# Patient Record
Sex: Female | Born: 1970 | Race: White | Hispanic: No | Marital: Married | Smoking: Former smoker
Health system: Southern US, Community
[De-identification: ages and names within clinical notes are randomized; demographics above are authoritative.]

## PROBLEM LIST (undated history)

## (undated) DIAGNOSIS — K589 Irritable bowel syndrome without diarrhea: Secondary | ICD-10-CM

## (undated) DIAGNOSIS — T7840XA Allergy, unspecified, initial encounter: Secondary | ICD-10-CM

## (undated) DIAGNOSIS — H269 Unspecified cataract: Secondary | ICD-10-CM

## (undated) DIAGNOSIS — R760 Raised antibody titer: Secondary | ICD-10-CM

## (undated) DIAGNOSIS — J45909 Unspecified asthma, uncomplicated: Secondary | ICD-10-CM

## (undated) DIAGNOSIS — K648 Other hemorrhoids: Secondary | ICD-10-CM

## (undated) HISTORY — DX: Irritable bowel syndrome, unspecified: K58.9

## (undated) HISTORY — DX: Unspecified asthma, uncomplicated: J45.909

## (undated) HISTORY — DX: Other hemorrhoids: K64.8

## (undated) HISTORY — DX: Unspecified cataract: H26.9

## (undated) HISTORY — DX: Allergy, unspecified, initial encounter: T78.40XA

## (undated) HISTORY — PX: CATARACT EXTRACTION, BILATERAL: SHX1313

## (undated) HISTORY — PX: WISDOM TOOTH EXTRACTION: SHX21

## (undated) HISTORY — PX: DILATION AND CURETTAGE OF UTERUS: SHX78

## (undated) HISTORY — DX: Raised antibody titer: R76.0

---

## 1998-01-11 ENCOUNTER — Inpatient Hospital Stay (HOSPITAL_COMMUNITY): Admission: AD | Admit: 1998-01-11 | Discharge: 1998-01-11 | Payer: Self-pay | Admitting: Obstetrics and Gynecology

## 1998-01-11 ENCOUNTER — Other Ambulatory Visit: Admission: RE | Admit: 1998-01-11 | Discharge: 1998-01-11 | Payer: Self-pay | Admitting: *Deleted

## 1998-01-31 ENCOUNTER — Other Ambulatory Visit: Admission: RE | Admit: 1998-01-31 | Discharge: 1998-01-31 | Payer: Self-pay | Admitting: *Deleted

## 1999-03-12 ENCOUNTER — Other Ambulatory Visit: Admission: RE | Admit: 1999-03-12 | Discharge: 1999-03-12 | Payer: Self-pay | Admitting: *Deleted

## 2000-05-12 ENCOUNTER — Other Ambulatory Visit: Admission: RE | Admit: 2000-05-12 | Discharge: 2000-05-12 | Payer: Self-pay | Admitting: *Deleted

## 2001-11-16 ENCOUNTER — Other Ambulatory Visit: Admission: RE | Admit: 2001-11-16 | Discharge: 2001-11-16 | Payer: Self-pay | Admitting: *Deleted

## 2003-03-27 ENCOUNTER — Other Ambulatory Visit: Admission: RE | Admit: 2003-03-27 | Discharge: 2003-03-27 | Payer: Self-pay | Admitting: *Deleted

## 2003-08-02 ENCOUNTER — Ambulatory Visit (HOSPITAL_COMMUNITY): Admission: RE | Admit: 2003-08-02 | Discharge: 2003-08-02 | Payer: Self-pay | Admitting: Obstetrics and Gynecology

## 2003-08-15 ENCOUNTER — Encounter: Admission: RE | Admit: 2003-08-15 | Discharge: 2003-08-15 | Payer: Self-pay | Admitting: Obstetrics and Gynecology

## 2003-09-08 ENCOUNTER — Inpatient Hospital Stay (HOSPITAL_COMMUNITY): Admission: AD | Admit: 2003-09-08 | Discharge: 2003-09-14 | Payer: Self-pay | Admitting: Obstetrics and Gynecology

## 2003-10-23 ENCOUNTER — Other Ambulatory Visit: Admission: RE | Admit: 2003-10-23 | Discharge: 2003-10-23 | Payer: Self-pay | Admitting: Obstetrics and Gynecology

## 2004-05-28 ENCOUNTER — Ambulatory Visit: Payer: Self-pay | Admitting: Internal Medicine

## 2004-05-29 ENCOUNTER — Ambulatory Visit: Payer: Self-pay | Admitting: Internal Medicine

## 2004-12-06 ENCOUNTER — Other Ambulatory Visit: Admission: RE | Admit: 2004-12-06 | Discharge: 2004-12-06 | Payer: Self-pay | Admitting: Obstetrics and Gynecology

## 2008-01-14 DIAGNOSIS — R109 Unspecified abdominal pain: Secondary | ICD-10-CM | POA: Insufficient documentation

## 2008-01-14 DIAGNOSIS — K589 Irritable bowel syndrome without diarrhea: Secondary | ICD-10-CM | POA: Insufficient documentation

## 2008-01-17 ENCOUNTER — Ambulatory Visit: Payer: Self-pay | Admitting: Internal Medicine

## 2008-02-08 ENCOUNTER — Ambulatory Visit (HOSPITAL_COMMUNITY): Admission: RE | Admit: 2008-02-08 | Discharge: 2008-02-08 | Payer: Self-pay | Admitting: Internal Medicine

## 2009-05-08 ENCOUNTER — Encounter (INDEPENDENT_AMBULATORY_CARE_PROVIDER_SITE_OTHER): Payer: Self-pay | Admitting: *Deleted

## 2009-06-12 ENCOUNTER — Encounter (INDEPENDENT_AMBULATORY_CARE_PROVIDER_SITE_OTHER): Payer: Self-pay | Admitting: *Deleted

## 2009-06-14 ENCOUNTER — Ambulatory Visit: Payer: Self-pay | Admitting: Internal Medicine

## 2009-07-03 ENCOUNTER — Ambulatory Visit: Payer: Self-pay | Admitting: Internal Medicine

## 2010-02-03 IMAGING — US US ABDOMEN COMPLETE
1 series · 14 of 25 positions shown · non-contrast
Comparison: None relevant.

CLINICAL DATA: Abdominal pain.

ABDOMEN ULTRASOUND
TECHNIQUE: Complete abdominal ultrasound examination was performed
including evaluation of the liver, gallbladder, bile ducts,
pancreas, kidneys, spleen, IVC, and abdominal aorta.

[Series 1: us abdomen complete · 0.24mm/px · 14 of 55 slices shown]
[im 1/55]
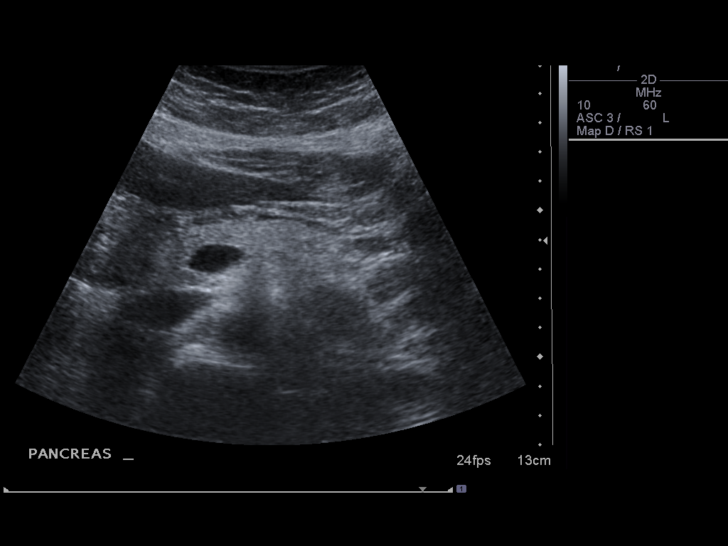
[im 5/55]
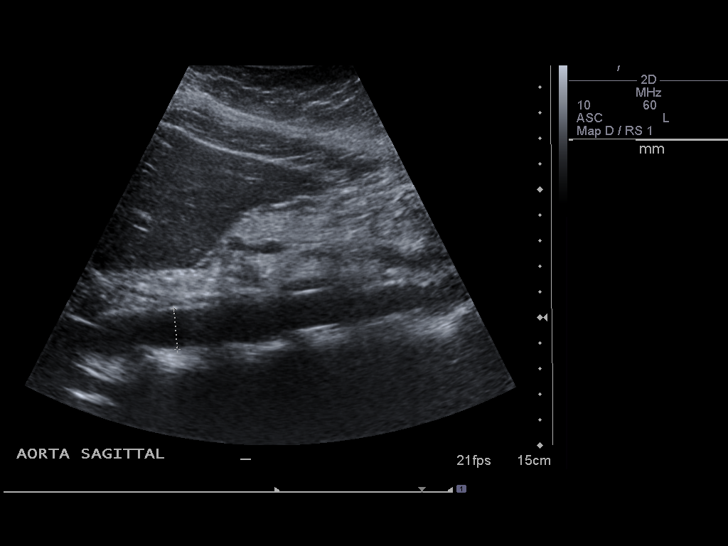
[im 10/55]
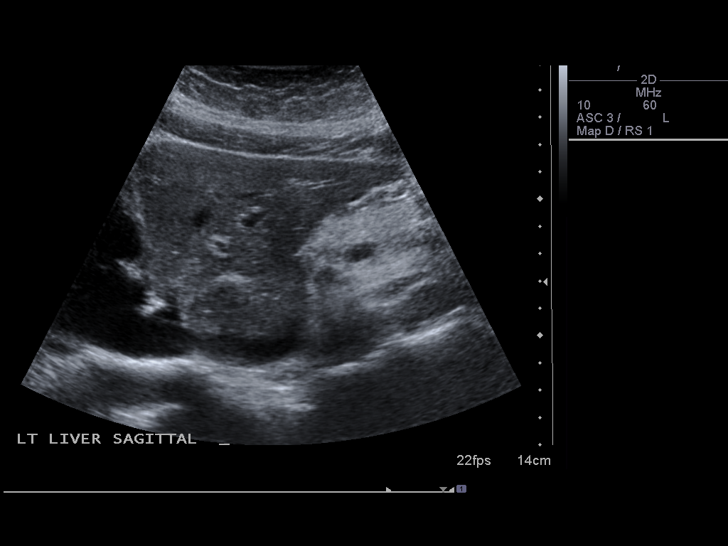
[im 14/55]
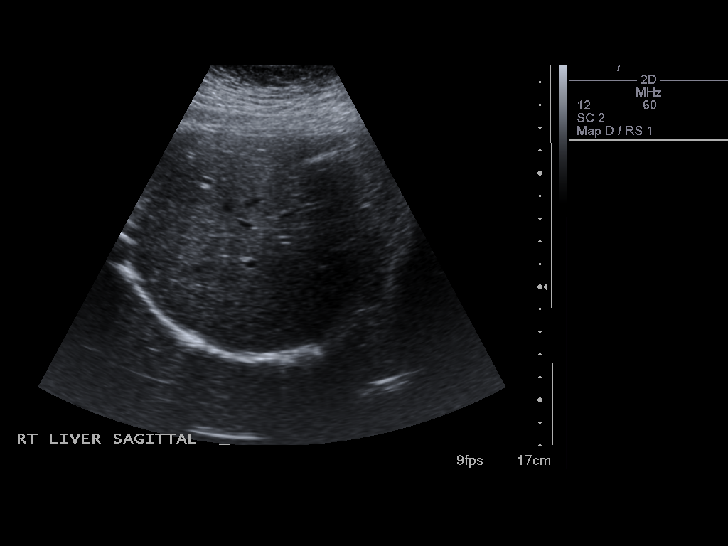
[im 19/55]
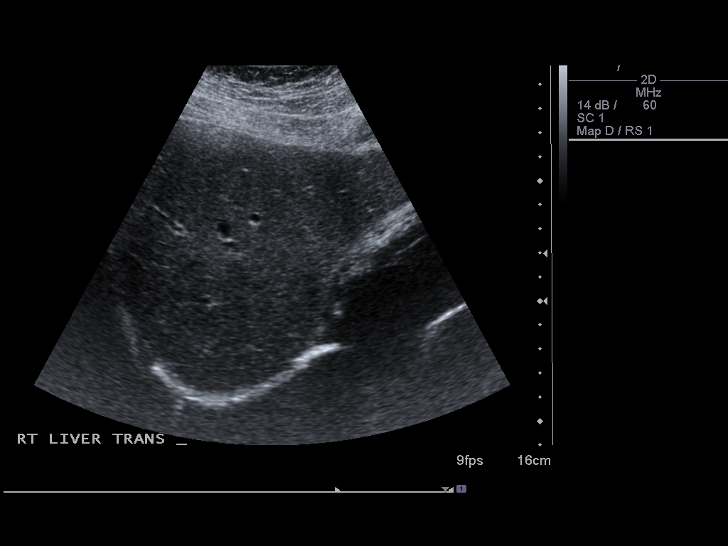
[im 21/55]
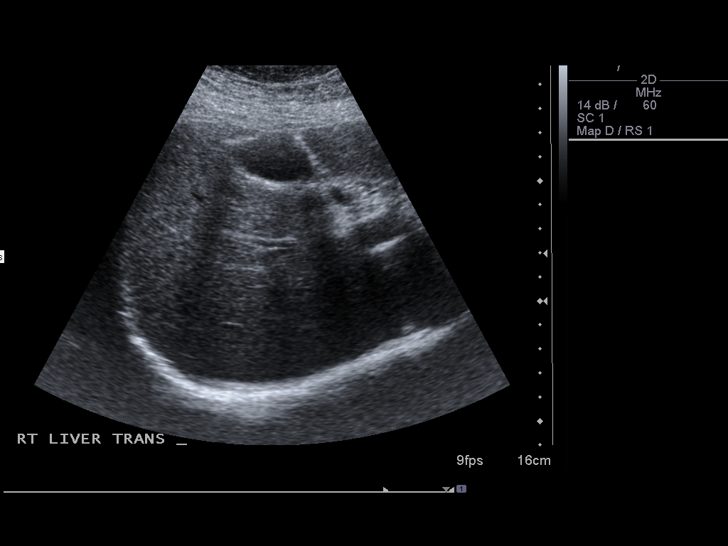
[im 25/55]
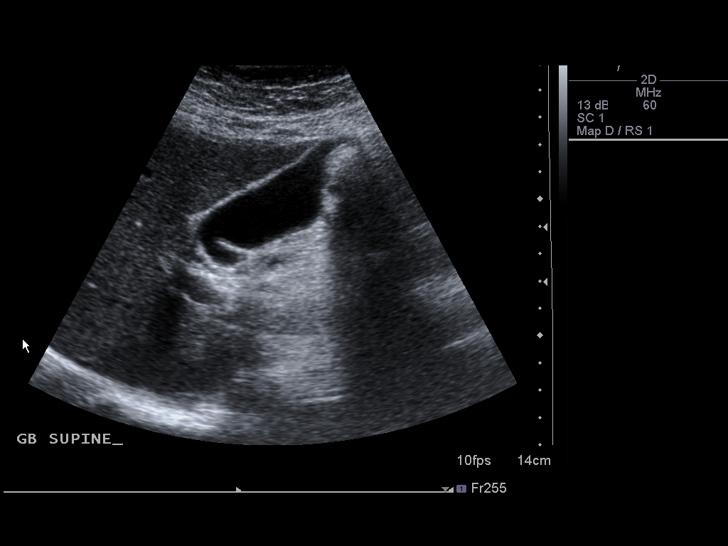
[im 30/55]
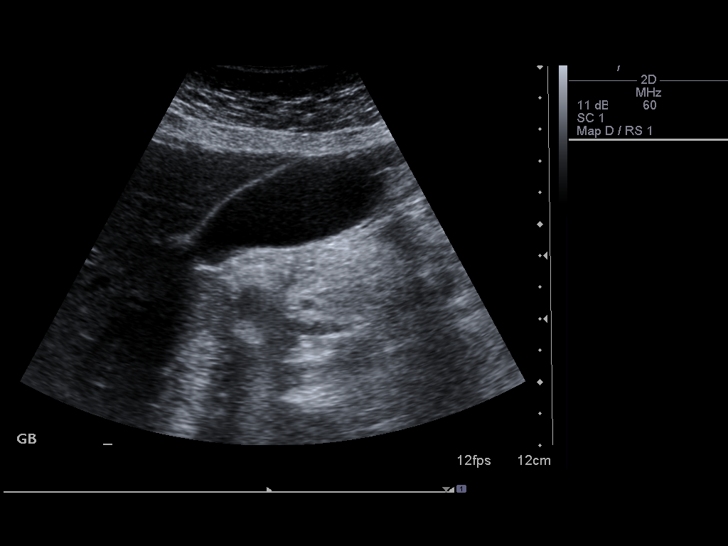
[im 34/55]
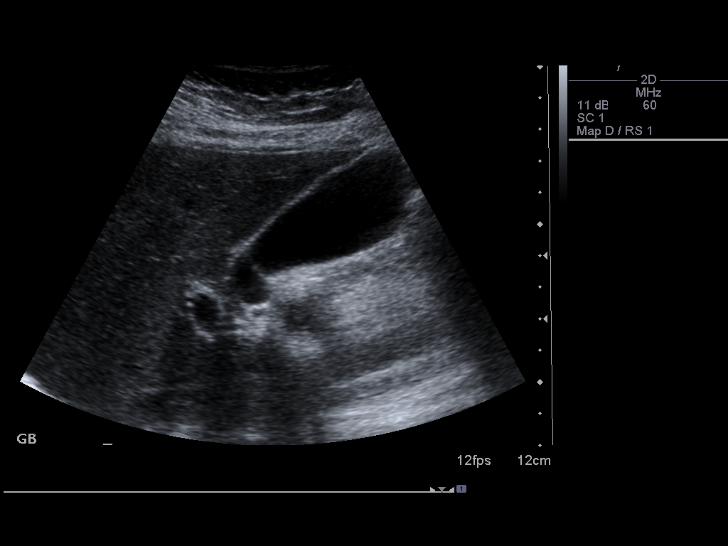
[im 37/55]
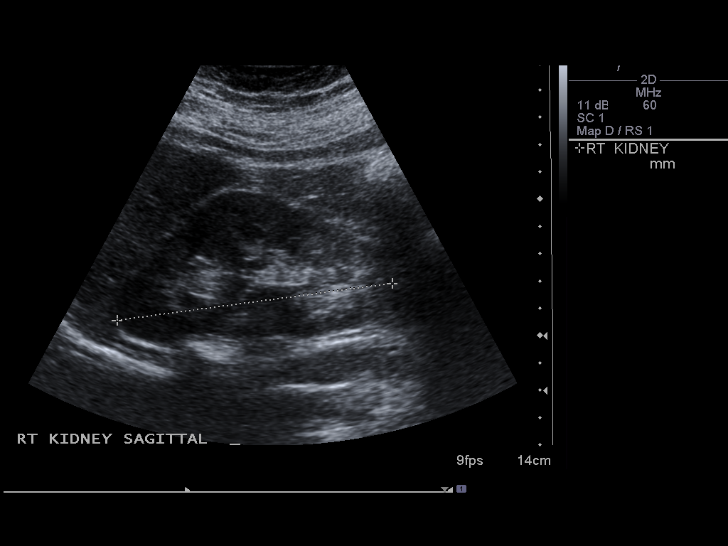
[im 41/55]
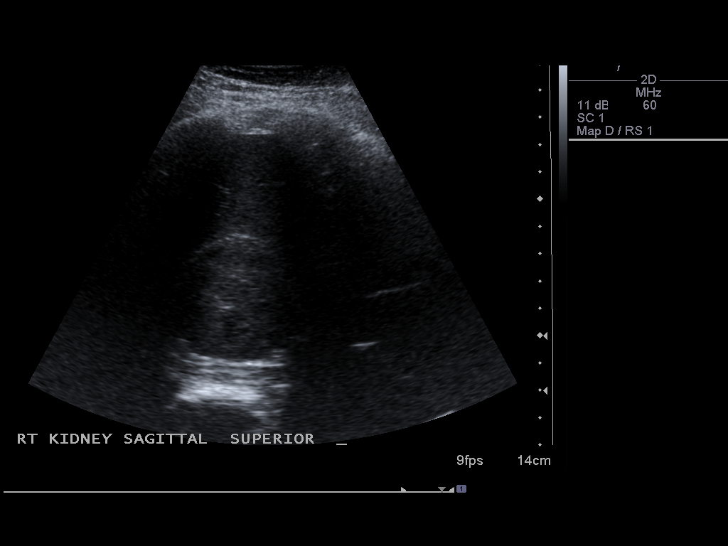
[im 46/55]
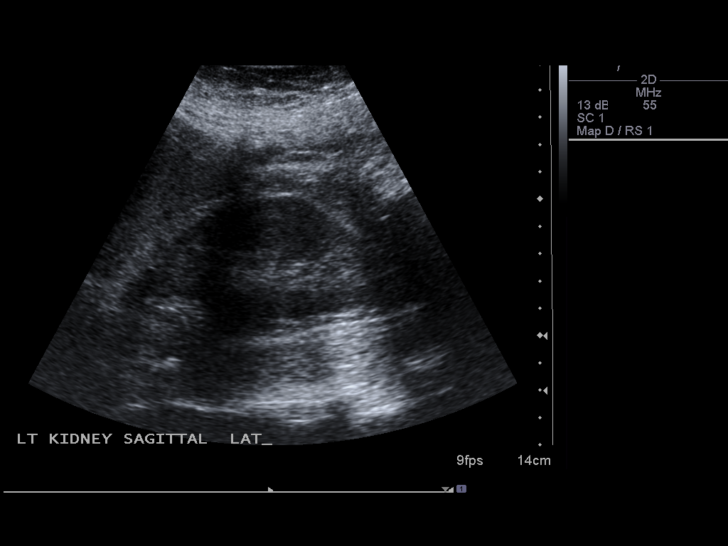
[im 50/55]
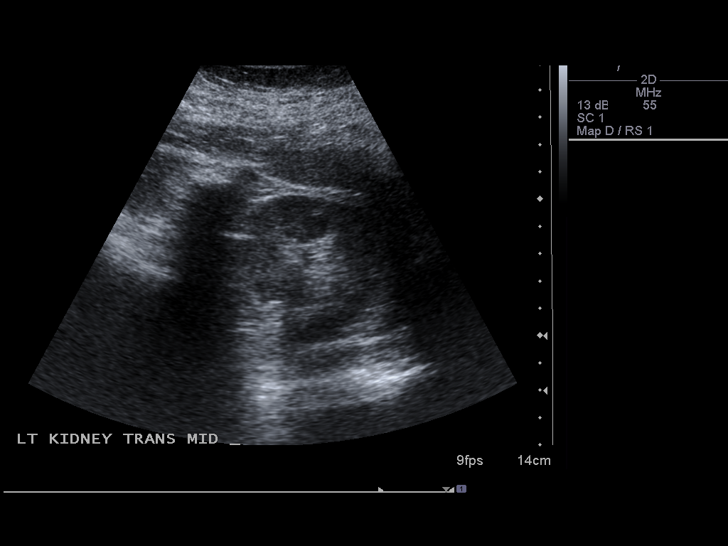
[im 55/55]
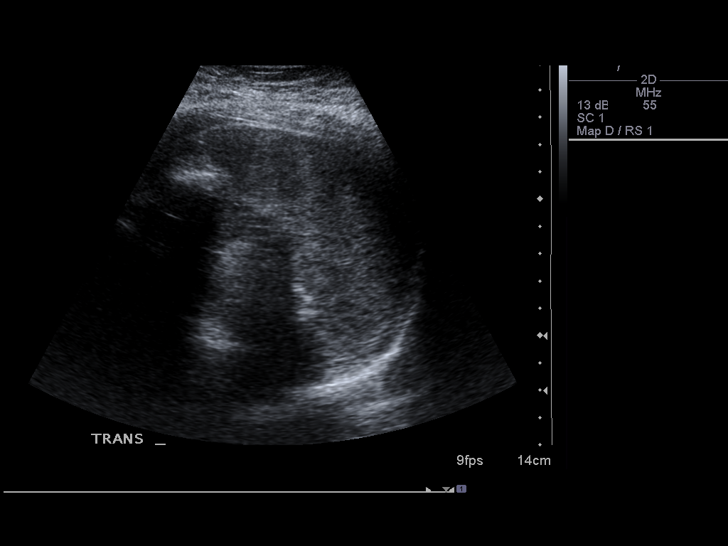

[14 of 25 positions shown; findings below may reference images not displayed]

FINDINGS: The gallbladder appears normal without gallstones or wall
thickening.  There is no biliary dilatation.  The visualized
portions of the liver, spleen, pancreas, IVC and abdominal aorta
appear normal.

Both kidneys appear normal.  The right kidney measures 10.2 cm in
length and the left kidney measures 10.8 cm in length.  There is no
hydronephrosis.
IMPRESSION: Normal abdominal ultrasound.  No evidence of biliary disease.

## 2010-02-28 NOTE — Miscellaneous (Signed)
Summary: anusol [facility] RX  Clinical Lists Changes  Medications: Added new medication of ANUSOL-HC 25 MG  SUPP (HYDROCORTISONE ACETATE) per rectum at bedtime as prn - Signed Rx of ANUSOL-HC 25 MG  SUPP (HYDROCORTISONE ACETATE) per rectum at bedtime as prn;  #12 x 1;  Signed;  Entered by: Greer Ee RN;  Authorized by: Hart Carwin MD;  Method used: Electronically to [facility]*, [address] [place], [place], [place]  [address], Ph: [phone], Fax: [phone]    Prescriptions: ANUSOL-HC 25 MG  SUPP (HYDROCORTISONE ACETATE) per rectum at bedtime as prn  #12 x 1   Entered by:   Greer Ee RN   Authorized by:   Hart Carwin MD   Signed by:   Greer Ee RN on 07/03/2009   Method used:   Electronically to        [facility]* (retail)       [address]       [place], [place]  [address]       Ph: [phone]       Fax: [phone]   RxID:   [id number]

## 2010-02-28 NOTE — Letter (Signed)
Summary: [facility] Instructions  [name]  [address] [place], [place] [address]   Phone: [phone]  Fax: [phone]       Joy Foster    06/17/1970    MRN: [id number]       Procedure Day Dorna Bloom:  [name] 07/03/09     Arrival Time:  8:00am     Procedure Time:  9:00am    Location of Procedure:                    [name]  [facility] (4th Floor) _   PREPARATION FOR COLONOSCOPY WITH MIRALAX  Starting 5 days prior to your procedure  THURSDAY 06/02  do not eat nuts, seeds, popcorn, corn, beans, peas,  salads, or any raw vegetables.  Do not take any fiber supplements (e.g. Metamucil, Citrucel, and Benefiber). ____________________________________________________________________________________________________   THE DAY BEFORE YOUR PROCEDURE         DATE: 07-02-09, Monday  1   Drink clear liquids the entire day-NO SOLID FOOD  2   Do not drink anything colored red or purple.  Avoid juices with pulp.  No orange juice.  3   Drink at least 64 oz. (8 glasses) of fluid/clear liquids during the day to prevent dehydration and help the prep work efficiently.  CLEAR LIQUIDS INCLUDE: Water Jello Ice Popsicles Tea (sugar ok, no milk/cream) Powdered fruit flavored drinks Coffee (sugar ok, no milk/cream) Gatorade Juice: apple, white grape, white cranberry  Lemonade Clear bullion, consomm, broth Carbonated beverages (any kind) Strained chicken noodle soup Hard Candy  4   Mix the entire bottle of Miralax with 64 oz. of Gatorade/Powerade in the morning and put in the refrigerator to chill.  5   At 3:00 pm take 2 Dulcolax/Bisacodyl tablets.  6   At 4:30 pm take one Reglan/Metoclopramide tablet.  7  Starting at 5:00 pm drink one 8 oz glass of the Miralax mixture every 15-20 minutes until you have finished drinking the entire 64 oz.  You should finish drinking prep around 7:30 or 8:00 pm.  8   If you are nauseated, you may take the 2nd  Reglan/Metoclopramide tablet at 6:30 pm.        9    At 8:00 pm take 2 more DULCOLAX/Bisacodyl tablets.     THE DAY OF YOUR PROCEDURE      DATE:  TUESDAY 06/07  You may drink clear liquids until  7:00am   (2 HOURS BEFORE PROCEDURE).   MEDICATION INSTRUCTIONS  Unless otherwise instructed, you should take regular prescription medications with a small sip of water as early as possible the morning of your procedure.  Diabetic patients - see separate instructions.  Stop taking Plavix or Aggrenox on  _  _  (7 days before procedure).           Stop taking Coumadin on  _  _  (5 days before procedure).  Additional medication instructions: _  _         OTHER INSTRUCTIONS  You will need a responsible adult at least 38 years of age to accompany you and drive you home.   This person must remain in the waiting room during your procedure.  Wear loose fitting clothing that is easily removed.  Leave jewelry and other valuables at home.  However, you may wish to bring a book to read or an iPod/MP3 player to listen to music as you wait for your procedure to start.  Remove all  body piercing jewelry and leave at home.  Total time from sign-in until discharge is approximately 2-3 hours.  You should go home directly after your procedure and rest.  You can resume normal activities the day after your procedure.  The day of your procedure you should not:   Drive   Make legal decisions   Operate machinery   Drink alcohol   Return to work  You will receive specific instructions about eating, activities and medications before you leave.   The above instructions have been reviewed and explained to me by   _______________________    I fully understand and can verbalize these instructions _____________________________ Date _______

## 2010-02-28 NOTE — Letter (Signed)
Summary: Colonoscopy Letter  [facility]  [address] [place], [place] [address]   Phone: [phone]  Fax: [phone]      May 08, 2009 MRN: [id number]   Joy Foster [address] [place], [place]  [address]   Dear [name],   According to your medical record, it is time for you to schedule a Colonoscopy. The American Cancer Society recommends this procedure as a method to detect early colon cancer. Patients with a family history of colon cancer, or a personal history of colon polyps or inflammatory bowel disease are at increased risk.  This letter has beeen generated based on the recommendations made at the time of your procedure. If you feel that in your particular situation this may no longer apply, please contact our office.  Please call our office at [phone] to schedule this appointment or to update your records at your earliest convenience.  Thank you for cooperating with [place] to provide you with the very best care possible.   Sincerely,  Hedwig Morton. Juanda Chance, M.D  [facility] [phone]

## 2010-02-28 NOTE — Miscellaneous (Signed)
Summary: [facility] previsit  Clinical Lists Changes  Medications: Added new medication of MIRALAX   POWD (POLYETHYLENE GLYCOL 3350) As per prep  instructions. - Signed Added new medication of METOCLOPRAMIDE HCL 10 MG  TABS (METOCLOPRAMIDE HCL) As per prep instructions. - Signed Added new medication of DULCOLAX 5 MG  TBEC (BISACODYL) Day before procedure take 2 at 3pm and 2 at 8pm. - Signed Rx of MIRALAX   POWD (POLYETHYLENE GLYCOL 3350) As per prep  instructions.;  #255gm x 0;  Signed;  Entered by: Karl Bales RN;  Authorized by: Hart Carwin MD;  Method used: Electronically to [facility]*, [address], [place], [place], [place]  [address], Ph: [phone], Fax: [phone] Rx of METOCLOPRAMIDE HCL 10 MG  TABS (METOCLOPRAMIDE HCL) As per prep instructions.;  #2 x 0;  Signed;  Entered by: Karl Bales RN;  Authorized by: Hart Carwin MD;  Method used: Electronically to [facility]*, [address], [place], [place], [place]  [address], Ph: [phone], Fax: [phone] Rx of DULCOLAX 5 MG  TBEC (BISACODYL) Day before procedure take 2 at 3pm and 2 at 8pm.;  #4 x 0;  Signed;  Entered by: Karl Bales RN;  Authorized by: Hart Carwin MD;  Method used: Electronically to [facility]*, [address], [place], [place], [place]  [address], Ph: [phone], Fax: [phone]    Prescriptions: DULCOLAX 5 MG  TBEC (BISACODYL) Day before procedure take 2 at 3pm and 2 at 8pm.  #4 x 0   Entered by:   Karl Bales RN   Authorized by:   Hart Carwin MD   Signed by:   Karl Bales RN on 06/14/2009   Method used:   Electronically to        [facility]* (retail)       [address]       [place], [place]  [address]       Ph: [phone]       Fax: [phone]   RxID:   [id number] METOCLOPRAMIDE HCL 10 MG  TABS (METOCLOPRAMIDE HCL) As per prep instructions.  #2 x 0   Entered by:   Karl Bales RN   Authorized by:   Hart Carwin MD   Signed by:   Karl Bales RN on 06/14/2009   Method used:   Electronically to        [facility]* (retail)       [address]       [place], [place]  [address]       Ph: [phone]       Fax: [phone]   RxID:   [id number] MIRALAX   POWD (POLYETHYLENE GLYCOL 3350) As per prep  instructions.  #255gm x 0   Entered by:   Karl Bales RN   Authorized by:   Hart Carwin MD   Signed by:   Karl Bales RN on 06/14/2009   Method used:   Electronically to        [facility]* (retail)       [address]       [place], [place]  [address]       Ph: [phone]       Fax: [phone]   RxID:   [id number]

## 2010-02-28 NOTE — Procedures (Signed)
Summary: Colonoscopy  Patient: Toniqua Melamed Note: All result statuses are Final unless otherwise noted.  Tests: (1) Colonoscopy (COL)   COL Colonoscopy           DONE (C)     [facility]     [address]     [place], [place]  [address]           COLONOSCOPY PROCEDURE REPORT           PATIENT:  Genavie, Boettger  MR#:  [id number]     BIRTHDATE:  February 14, 1970, 39 yrs. old  GENDER:  female     ENDOSCOPIST:  Hedwig Morton. Juanda Chance, MD     REF. BY:  Morton Amy, M.D.     PROCEDURE DATE:  07/03/2009     PROCEDURE:  Colonoscopy [address]     ASA CLASS:  Class I     INDICATIONS:  father with polyposis and colon cancer age 78     last colon 2006     MEDICATIONS:   Versed 10 mg, Fentanyl 100 mcg           DESCRIPTION OF PROCEDURE:   After the risks benefits and     alternatives of the procedure were thoroughly explained, informed     consent was obtained.  Digital rectal exam was performed and     revealed no rectal masses.   The LB CF-[id number] endoscope     was introduced through the anus and advanced to the cecum, which     was identified by both the appendix and ileocecal valve, without     limitations.  The quality of the prep was good, using MiraLax.     The instrument was then slowly withdrawn as the colon was fully     examined.     <<PROCEDUREIMAGES>>           FINDINGS:  Internal hemorrhoids were found (see image5).  This was     otherwise a normal examination of the colon (see image4, image3,     image2, and image1).   Retroflexed views in the rectum revealed no     abnormalities.    The scope was then withdrawn from the patient     and the procedure completed.           COMPLICATIONS:  None     ENDOSCOPIC IMPRESSION:     1) Internal hemorrhoids     2) Otherwise normal examination     RECOMMENDATIONS:     1) high fiber diet     AnusolHC supp, #12, 1 qhs prn hemorrhoids     HIDA scan with CCK for eval. of RUQ abd. pain     REPEAT EXAM:  In 5 year(s) for.           ______________________________     Hedwig Morton. Juanda Chance, MD           CC:           n.     REVISED:  07/03/2009 09:46 AM     eSIGNED:   Hedwig Morton. Canaan Holzer at 07/03/2009 09:46 AM           Park Meo, [phone]  Note: An exclamation mark (!) indicates a result that was not dispersed into the flowsheet. Document Creation Date: 07/03/2009 9:47 AM _______________________________________________________________________  (1) Order result status: Final Collection or observation date-time: 07/03/2009 09:37 Requested date-time:  Receipt date-time:  Reported date-time:  Referring Physician:   Ordering Physician: Lina Sar (  [id number]) Specimen Source:  Source: Launa Grill Order Number: [id number] Lab site:   Appended Document: Colonoscopy    Clinical Lists Changes  Observations: Added new observation of COLONNXTDUE: 06/2014 (07/03/2009 12:56)

## 2010-06-14 NOTE — Op Note (Signed)
Joy Foster, ZHAO                         ACCOUNT NO.:  [id number]   MEDICAL RECORD NO.:  [id number]                   PATIENT TYPE:  INP   LOCATION:  9139                                 FACILITY:  [facility]   PHYSICIAN:  Guy Sandifer. Arleta Creek, M.D.           DATE OF BIRTH:  [birth year]   DATE OF PROCEDURE:  09/12/2003  DATE OF DISCHARGE:                                 OPERATIVE REPORT   PREOPERATIVE DIAGNOSIS:  Postpartum vaginal vulvar hematoma.   POSTOPERATIVE DIAGNOSIS:  Postpartum vaginal vulvar hematoma.   PROCEDURES:  Evacuation of vaginal vulvar hematoma.   SURGEON:  Harold Hedge, M.D.   ANESTHESIA:  Spinal, Cline Crock, M.D.   ESTIMATED BLOOD LOSS:  250 mL.   INDICATIONS AND CONSENT:  This patient is a 40 year old, white female, G6,  P3, status post vaginal delivery of a viable female infant at 33-1/2 weeks  estimated gestational age, approximately 2-3 hours prior to surgery.  About  1-1/2 hours postpartum, she began to complain of rectal and vaginal pain.  I  was called to see the patient and noted a 6-8 cm hematoma, extending from  the left vaginal sidewall onto the perineum.  Ice was applied.  After  observation of approximately 30-60 minutes, it was obviously growing to  approximately 8-10 cm, and the patient was complaining of pain.  The patient  was medicated.  Options were discussed with the patient and her husband.  Evacuation was discussed.  Potential complications were discussed including  but not limited to infection, pain, and dyspareunia.  Possible placement of  drain was also discussed.  All questions were answered.   DESCRIPTION OF PROCEDURE:  The patient is given Unasyn IV 3 g  preoperatively.  She is taken to the operating room where she is identified,  and a spinal anesthetic is placed.  She is then placed in the dorsal  lithotomy position where she is prepped vaginally and vulva and the bladder  straight-catheterized.  She is draped in a sterile  fashion.  An incision is  made along the exterior margin of the hymenal caruncles down to the 5  o'clock position in the vaginal introitus.  This is then carried deeper  until the hematoma is entered, upon which time blood squirts out rather  forcefully.  Approximately 250 mL of blood and clot are obtained from this.  The space is irrigated.  A single area of bleeding on the vaginal submucosa  is identified.  Three 2-0 chromic ligatures are placed which slows the  bleeding down substantially.  Palpation reveals this to be well clear of the  course of the urethra.  A small round J-P drain is then exited through the  left perineum and placed in the incision.  The incision is closed in a  running locking fashion with 2-0 chromic suture, leaving a 0.5 cm open space  at the inferior margin.  The vagina is then packed with 0.5  inch plain  gauze.  The patient tolerated the procedure well, and all counts are  correct.  The patient is taken to the recovery room in stable condition.                                               Guy Sandifer Arleta Creek, M.D.    JET/MEDQ  D:  09/12/2003  T:  09/12/2003  Job:  [id number]

## 2010-06-14 NOTE — Discharge Summary (Signed)
Joy Foster, Joy Foster             ACCOUNT NO.:  [id number]   MEDICAL RECORD NO.:  [id number]          PATIENT TYPE:  INP   LOCATION:  9139                          [facility]ACILITY[facility]   PHYSICIAN:  Guy Sandifer. Henderson Cloud, M.D. DATE OF BIRTH:  1970/06/30   DATE OF ADMISSION:  09/08/2003  DATE OF DISCHARGE:  09/14/2003                                 DISCHARGE SUMMARY   ADMITTING DIAGNOSES:  1.  Intrauterine pregnancy at 33-and-a-half weeks estimated gestational age.  2.  Premature rupture of membranes.  3.  Positive group B beta streptococcus.   DISCHARGE DIAGNOSES:  1.  Status post spontaneous vaginal delivery.  2.  Viable female infant.  3.  Status post evacuation of a vulvovaginal hematoma.   PROCEDURE:  1.  Spontaneous vaginal delivery.  2.  Evacuation of a vulvovaginal hematoma.   REASON FOR ADMISSION:  Please see written H&P.   HOSPITAL COURSE:  The patient was a 40 year old white married female gravida  6 para 2 that was admitted to [facility] at 33-and-a-half  weeks estimated gestational age with spontaneous rupture of membranes.  The  patient was noted to have mild irregular contractions.  She was also known  to have positive group B beta strep.  On admission, fetal heart tones were  reactive.  Cervix was dilated 2 cm, 75% effaced, vertex at a -1 station.  Ultrasound was performed which revealed an AFI of 14.84, estimated fetal  weight of 2147 g, a grade 3 placenta.  Biophysical profile was performed  which revealed a score of 8/8.  The initial plan was to allow the patient to  labor.  However, without change in cervix and irregular contractions the  decision was made to administer p.o. tocolytics to allow time for  betamethasone to enhance fetal lung maturity.  Decision was not to be more  aggressive with other tocolytics in view of positive group B beta strep with  ruptured membranes.  On the following morning, the patient was afebrile.  Abdomen was soft and  nontender.  Fetal heart tones were reactive.  On the  following day, vital signs continued to be stable and the patient was  afebrile.  Minimal contractions were noted at the present.  The patient was  now 42 hours status post betamethasone administration.  There was some  consideration in stopping the Procardia after 72 hours.  On the following  morning, the patient was without complaint.  She continued to be leaking  amniotic fluid with movement.  Vital signs were stable; she was afebrile.  CBC revealed hemoglobin of 9.7; wbc count of 9.0; and platelet count of  249,000.  Uterus was nontender.  Decision was made to discontinue the  Procardia and check a biophysical profile.  On the following morning, the  patient did progress in labor.  She subsequently delivered a spontaneous  vaginal viable female with Apgars of 8 at one minute and 9 at five minutes.  NICU was in attendance and the baby was transferred to the NICU.  The  patient did sustain a right periurethral laceration which was repaired.  The  patient  was doing well and was subsequently transferred to the mother-baby  unit.  Later that evening, the patient did complain of some rectovulvar pain  and was noted to have an 8-10 cm hematoma on the left vaginal sidewall.  Decision was made to try to evacuate the hematoma.  The patient was then  subsequently transferred to the operating room where spinal anesthesia was  administered without difficulty.  Hematoma was evacuated with an estimated  blood loss of 250 mL.  A Jackson-Pratt drain was placed.  On the following  morning, the patient was feeling better.  Vital signs were stable, blood  pressure 142/88.  The patient was ambulating well with good pain relief.  The Jackson-Pratt was noted to have minimal output.  Hemoglobin was 10.2.  The patient was placed on oral antibiotics.  On the following morning, the  Jackson-Pratt was removed, discharge instructions were reviewed, and the   patient was discharged home.   CONDITION ON DISCHARGE:  Good.   DIET:  Regular as tolerated.   ACTIVITY:  Up as tolerated.   FOLLOW-UP:  The patient is to follow up in the office in 1 week.  She is to  call for temperature greater than 100 degrees, increase in vulvar edema, or  heavy vaginal bleeding.   DISCHARGE MEDICATIONS:  1.  Percocet 5/325 #30 one p.o. q.4-6h. p.r.n.  2.  Augmentin 875 mg one p.o. b.i.d. x5 days.  3.  Prenatal vitamins one p.o. daily.  4.  Colace one p.o. daily p.r.n.      CC/MEDQ  D:  10/18/2003  T:  10/18/2003  Job:  [id number]

## 2011-04-22 ENCOUNTER — Telehealth: Payer: Self-pay | Admitting: Internal Medicine

## 2011-04-22 NOTE — Telephone Encounter (Signed)
Patient is having nausea and stomach pain that seems to be getting worse. She is willing to see an extender but is not able to come this week. She will call back on Thursday to try to schedule with an extender.

## 2012-02-17 ENCOUNTER — Encounter: Payer: Self-pay | Admitting: *Deleted

## 2012-02-17 ENCOUNTER — Telehealth: Payer: Self-pay | Admitting: Internal Medicine

## 2012-02-17 NOTE — Telephone Encounter (Signed)
Patient calling to report nausea x 2 days. She took Zofran but continues to have nausea. Denies vomiting. C/O RUQ pain x 2 weeks. Pain is right under rib cage. Increase in belching. Denies fever and vomiting. Scheduled patient with Dr. Juanda Chance on 02/18/12 at 2:45 PM.

## 2012-02-18 ENCOUNTER — Other Ambulatory Visit (INDEPENDENT_AMBULATORY_CARE_PROVIDER_SITE_OTHER): Payer: BC Managed Care – PPO

## 2012-02-18 ENCOUNTER — Encounter: Payer: Self-pay | Admitting: Internal Medicine

## 2012-02-18 ENCOUNTER — Ambulatory Visit (INDEPENDENT_AMBULATORY_CARE_PROVIDER_SITE_OTHER): Payer: BC Managed Care – PPO | Admitting: Internal Medicine

## 2012-02-18 VITALS — BP 110/74 | HR 60 | Ht 62.0 in | Wt 152.0 lb

## 2012-02-18 DIAGNOSIS — R1011 Right upper quadrant pain: Secondary | ICD-10-CM

## 2012-02-18 DIAGNOSIS — R110 Nausea: Secondary | ICD-10-CM

## 2012-02-18 LAB — CBC WITH DIFFERENTIAL/PLATELET
Basophils Absolute: 0 10*3/uL (ref 0.0–0.1)
Eosinophils Relative: 2.9 % (ref 0.0–5.0)
Lymphs Abs: 1.2 10*3/uL (ref 0.7–4.0)
MCHC: 34.3 g/dL (ref 30.0–36.0)
MCV: 84.4 fl (ref 78.0–100.0)
Monocytes Absolute: 0.8 10*3/uL (ref 0.1–1.0)
Monocytes Relative: 13.7 % — ABNORMAL HIGH (ref 3.0–12.0)
Neutro Abs: 3.6 10*3/uL (ref 1.4–7.7)
Neutrophils Relative %: 62.6 % (ref 43.0–77.0)
RBC: 4.5 Mil/uL (ref 3.87–5.11)
WBC: 5.8 10*3/uL (ref 4.5–10.5)

## 2012-02-18 LAB — TSH: TSH: 0.75 u[IU]/mL (ref 0.35–5.50)

## 2012-02-18 LAB — COMPREHENSIVE METABOLIC PANEL
AST: 18 U/L (ref 0–37)
Albumin: 3.7 g/dL (ref 3.5–5.2)
Calcium: 8.7 mg/dL (ref 8.4–10.5)
GFR: 119.96 mL/min (ref >60.00)
Glucose, Bld: 96 mg/dL (ref 70–99)
Total Bilirubin: 0.5 mg/dL (ref 0.3–1.2)

## 2012-02-18 LAB — AMYLASE: Amylase: 74 U/L (ref 27–131)

## 2012-02-18 MED ORDER — ONDANSETRON HCL 4 MG PO TABS: 4.0000 mg | ORAL_TABLET | Freq: Three times a day (TID) | ORAL | Status: DC | PRN

## 2012-02-18 MED ORDER — RANITIDINE HCL 300 MG PO TABS: 300.0000 mg | ORAL_TABLET | Freq: Every day | ORAL | Status: DC

## 2012-02-18 NOTE — Progress Notes (Signed)
Joy Foster 05-02-1970 MRN [id number]        History of Present Illness:  This is a 42 year old white female with the right costal margin discomfort which comes intermittently about 20 minutes postprandially. And it has been occurring for several months and actually for several years. We saw her for similar problem in 2009 when she had a negative upper abdominal ultrasound. She has chronic diarrhea attributed to irritable bowel syndrome She has a F hx of colon cancer and colon polyps in her father . Her weight has increased to 12 pounds since 2009. There is no hx alcohol intake. There is positive family history of gallbladder disease in her maternal grandmother. The abdominal pain is associated with heaviness across the chest and nausea without vomiting. The pain is not positional.   Past Medical History  Diagnosis Date  . IBS (irritable bowel syndrome)   . Internal hemorrhoids    Past Surgical History  Procedure Date  . Dilation and curettage of uterus   . Cataract extraction, bilateral   . Cesarean section     reports that she has quit smoking. She has never used smokeless tobacco. She reports that she does not drink alcohol or use illicit drugs. family history includes Colon cancer (age of onset:53) in her father; Diabetes in her paternal grandfather; and Heart disease in her maternal grandfather. Allergies  Allergen Reactions  . Sulfa Antibiotics Anaphylaxis and Hives        Review of Systems: Occasional heartburn. Chronic nausea  The remainder of the 10 point ROS is negative except as outlined in H&P   Physical Exam: General appearance  Well developed, in no distress. Eyes- non icteric. HEENT nontraumatic, normocephalic. Mouth no lesions, tongue papillated, no cheilosis. Neck supple without adenopathy, thyroid not enlarged, no carotid bruits, no JVD. Lungs Clear to auscultation bilaterally. Cor normal S1, normal S2, regular rhythm, no murmur,  quiet  precordium. Abdomen: Soft relaxed with striae, normal active bowel sounds. No distention. These. Tenderness along right costal margin. Liver edge at costal margin. Liver span and not enlarged. Left upper quadrant unremarkable. Lower abdomen unremarkable Rectal: Not done Extremities no pedal edema. Skin no lesions. Neurological alert and oriented x 3. Psychological normal mood and affect.  Assessment and Plan:  Intermittent right upper quadrant abdominal pain radiating to the back and shoulder blade which was present in 2009 during similar evaluation. Her gallbladder ultrasound was negative. Suspect nonspecific dyspepsia , acalculous cholecystitis or irritable bowel syndrome. She has used antispasmodics in the past. This time we'll  proceed with the HIDA scan with CCK and start ranitidine 300 mg at bedtime. We will also check  liver function tests, sedimentation rate, amylase lipase CBC and TSH. She will take Zofran 4 mg when necessary nausea depending on the results of the HIDA scan we will consider upper endoscopy and /or CT scan of the abdomen  02/18/2012 Lina Sar

## 2012-02-18 NOTE — Patient Instructions (Addendum)
Your physician has requested that you go to the basement for the following lab work before leaving today: CMET, Amylase, Lipase, CBC, Sed Rate, TSH  You have been scheduled for a HIDA scan at [facility] (1st floor) on Friday 02/27/12. Please arrive 15 minutes prior to your scheduled appointment at 10:00 am. Make certain not to have anything to eat or drink at least 6 hours prior to your test. Should this appointment date or time not work well for you, please call radiology scheduling at [phone].  _____________________________________________________________________ hepatobiliary (HIDA) scan is an imaging procedure used to diagnose problems in the liver, gallbladder and bile ducts. In the HIDA scan, a radioactive chemical or tracer is injected into a vein in your arm. The tracer is handled by the liver like bile. Bile is a fluid produced and excreted by your liver that helps your digestive system break down fats in the foods you eat. Bile is stored in your gallbladder and the gallbladder releases the bile when you eat a meal. A special nuclear medicine scanner (gamma camera) tracks the flow of the tracer from your liver into your gallbladder and small intestine.  During your HIDA scan  You'll be asked to change into a hospital gown before your HIDA scan begins. Your health care team will position you on a table, usually on your back. The radioactive tracer is then injected into a vein in your arm.The tracer travels through your bloodstream to your liver, where it's taken up by the bile-producing cells. The radioactive tracer travels with the bile from your liver into your gallbladder and through your bile ducts to your small intestine.You may feel some pressure while the radioactive tracer is injected into your vein. As you lie on the table, a special gamma camera is positioned over your abdomen taking pictures of the tracer as it moves through your body. The gamma camera takes pictures  continually for about an hour. You'll need to keep still during the HIDA scan. This can become uncomfortable, but you may find that you can lessen the discomfort by taking deep breaths and thinking about other things. Tell your health care team if you're uncomfortable. The radiologist will watch on a computer the progress of the radioactive tracer through your body. The HIDA scan may be stopped when the radioactive tracer is seen in the gallbladder and enters your small intestine. This typically takes about an hour. In some cases extra imaging will be performed if original images aren't satisfactory, if morphine is given to help visualize the gallbladder or if the medication CCK is given to look at the contraction of the gallbladder. This test typically takes 2 hours to complete. ________________________________________________________________________  We have sent the following medications to your pharmacy for you to pick up at your convenience: Zofran Ranitidine  CC: Dr Donnel Saxon

## 2012-02-23 ENCOUNTER — Encounter: Payer: Self-pay | Admitting: *Deleted

## 2012-02-27 ENCOUNTER — Other Ambulatory Visit (HOSPITAL_COMMUNITY): Payer: BC Managed Care – PPO

## 2012-02-27 ENCOUNTER — Ambulatory Visit (HOSPITAL_COMMUNITY): Payer: BC Managed Care – PPO

## 2012-03-13 ENCOUNTER — Other Ambulatory Visit: Payer: Self-pay

## 2012-12-02 ENCOUNTER — Other Ambulatory Visit: Payer: Self-pay

## 2012-12-20 ENCOUNTER — Other Ambulatory Visit: Payer: Self-pay | Admitting: Internal Medicine

## 2012-12-20 MED ORDER — ONDANSETRON HCL 4 MG PO TABS: 4.0000 mg | ORAL_TABLET | Freq: Three times a day (TID) | ORAL | Status: AC | PRN

## 2014-05-17 ENCOUNTER — Encounter: Payer: Self-pay | Admitting: Internal Medicine

## 2014-06-08 ENCOUNTER — Encounter: Payer: Self-pay | Admitting: Internal Medicine

## 2014-07-25 ENCOUNTER — Ambulatory Visit (AMBULATORY_SURGERY_CENTER): Payer: Self-pay

## 2014-07-25 VITALS — Ht 61.5 in | Wt 150.2 lb

## 2014-07-25 DIAGNOSIS — Z800 Family history of malignant neoplasm of digestive organs: Secondary | ICD-10-CM

## 2014-07-25 MED ORDER — SUPREP BOWEL PREP KIT 17.5-3.13-1.6 GM/177ML PO SOLN
1.0000 | Freq: Once | ORAL | Status: DC
Start: 2014-07-25 — End: 2014-08-11

## 2014-07-25 NOTE — Progress Notes (Signed)
No allergies to eggs or soy No diet/weight loss meds No home oxygen No past problems with anesthesia except "light-weight"  Has email  Emmi instructions given for colonoscopy

## 2014-08-03 ENCOUNTER — Encounter: Payer: Self-pay | Admitting: Internal Medicine

## 2014-08-11 ENCOUNTER — Ambulatory Visit (AMBULATORY_SURGERY_CENTER): Payer: BC Managed Care – PPO | Admitting: Internal Medicine

## 2014-08-11 ENCOUNTER — Encounter: Payer: Self-pay | Admitting: Internal Medicine

## 2014-08-11 VITALS — BP 137/80 | HR 67 | Temp 98.8°F | Resp 24 | Ht 61.0 in | Wt 150.0 lb

## 2014-08-11 DIAGNOSIS — Z800 Family history of malignant neoplasm of digestive organs: Secondary | ICD-10-CM

## 2014-08-11 DIAGNOSIS — Z1211 Encounter for screening for malignant neoplasm of colon: Secondary | ICD-10-CM | POA: Diagnosis not present

## 2014-08-11 HISTORY — PX: COLONOSCOPY: SHX174

## 2014-08-11 MED ORDER — SODIUM CHLORIDE 0.9 % IV SOLN: 500.0000 mL | INTRAVENOUS | Status: DC

## 2014-08-11 MED ORDER — DICYCLOMINE HCL 20 MG PO TABS: 20.0000 mg | ORAL_TABLET | Freq: Every morning | ORAL | Status: DC

## 2014-08-11 NOTE — Progress Notes (Signed)
Transferred to recovery room. A/O x3, pleased with MAC.  VSS.  Report to Jill, RN. 

## 2014-08-11 NOTE — Op Note (Signed)
[name], [id number]   COLONOSCOPY PROCEDURE REPORT  PATIENT: Joy Foster, Joy Foster  MR#: [id number] BIRTHDATE: [birth year] , 44  yrs. old GENDER: female ENDOSCOPIST: Lafayette Dragon, MD REFERRED BY:Dr Imran Haque PROCEDURE DATE:  08/11/2014 PROCEDURE:   Colonoscopy, screening First Screening Colonoscopy - Avg.  risk and is 50 yrs.  old or older - No.  Prior Negative Screening - Now for repeat screening. Less than 10 yrs Prior Negative Screening - Now for repeat screening.  Above average risk  History of Adenoma - Now for follow-up colonoscopy & has been > or = to 3 yrs.  N/A  Polyps removed today? No Recommend repeat exam, <10 yrs? Yes high risk ASA CLASS:   Class II INDICATIONS:Screening for colonic neoplasia, FH Colon or Rectal Adenocarcinoma, and father with colon polyposis and colon cancer at age 20.  History of hemorrhoids.  Last colonoscopy in 2006 and in in June 2011. MEDICATIONS: Monitored anesthesia care and Propofol 300 mg IV  DESCRIPTION OF PROCEDURE:   After the risks benefits and alternatives of the procedure were thoroughly explained, informed consent was obtained.  The digital rectal exam revealed no abnormalities of the rectum.   The LB 1528  endoscope was introduced through the anus and advanced to the cecum, which was identified by both the appendix and ileocecal valve. No adverse events experienced.   The quality of the prep was good.  (MoviPrep was used)  The instrument was then slowly withdrawn as the colon was fully examined. Estimated blood loss is zero unless otherwise noted in this procedure report.      COLON FINDINGS: Moderate sized external and internal Grade II hemorrhoids were found.  Retroflexed views revealed no abnormalities. The time to cecum = 7.40 Withdrawal time = 6.21 The scope was withdrawn and the procedure completed. COMPLICATIONS: There were no immediate complications.  ENDOSCOPIC  IMPRESSION: Moderate sized external and internal Grade II hemorrhoids  RECOMMENDATIONS: High fiber diet  Recall colonoscopy in 5 years  eSigned:  Lafayette Dragon, MD 08/11/2014 12:22 PM   cc:

## 2014-08-11 NOTE — Patient Instructions (Signed)
YOU HAD AN ENDOSCOPIC PROCEDURE TODAY AT [facility]:   Refer to the procedure report that was given to you for any specific questions about what was found during the examination.  If the procedure report does not answer your questions, please call your gastroenterologist to clarify.  If you requested that your care partner not be given the details of your procedure findings, then the procedure report has been included in a sealed envelope for you to review at your convenience later.  YOU SHOULD EXPECT: Some feelings of bloating in the abdomen. Passage of more gas than usual.  Walking can help get rid of the air that was put into your GI tract during the procedure and reduce the bloating. If you had a lower endoscopy (such as a colonoscopy or flexible sigmoidoscopy) you may notice spotting of blood in your stool or on the toilet paper. If you underwent a bowel prep for your procedure, you may not have a normal bowel movement for a few days.  Please Note:  You might notice some irritation and congestion in your nose or some drainage.  This is from the oxygen used during your procedure.  There is no need for concern and it should clear up in a day or so.  SYMPTOMS TO REPORT IMMEDIATELY:   Following lower endoscopy (colonoscopy or flexible sigmoidoscopy):  Excessive amounts of blood in the stool  Significant tenderness or worsening of abdominal pains  Swelling of the abdomen that is new, acute  Fever of 100F or higher     For urgent or emergent issues, a gastroenterologist can be reached at any hour by calling [phone].   DIET: Your first meal following the procedure should be a small meal and then it is ok to progress to your normal diet. Heavy or fried foods are harder to digest and may make you feel nauseous or bloated.  Likewise, meals heavy in dairy and vegetables can increase bloating.  Drink plenty of fluids but you should avoid alcoholic beverages for 24  hours.  ACTIVITY:  You should plan to take it easy for the rest of today and you should NOT DRIVE or use heavy machinery until tomorrow (because of the sedation medicines used during the test).    FOLLOW UP: Our staff will call the number listed on your records the next business day following your procedure to check on you and address any questions or concerns that you may have regarding the information given to you following your procedure. If we do not reach you, we will leave a message.  However, if you are feeling well and you are not experiencing any problems, there is no need to return our call.  We will assume that you have returned to your regular daily activities without incident.  If any biopsies were taken you will be contacted by phone or by letter within the next 1-3 weeks.  Please call us at [phone] if you have not heard about the biopsies in 3 weeks.    SIGNATURES/CONFIDENTIALITY: You and/or your care partner have signed paperwork which will be entered into your electronic medical record.  These signatures attest to the fact that that the information above on your After Visit Summary has been reviewed and is understood.  Full responsibility of the confidentiality of this discharge information lies with you and/or your care-partner.  Hemorrhoids handout given Repeat Colonoscopy in 5 years

## 2014-08-14 ENCOUNTER — Telehealth: Payer: Self-pay | Admitting: *Deleted

## 2014-08-14 NOTE — Telephone Encounter (Signed)
  Follow up Call-  Call back number 08/11/2014  Post procedure Call Back phone  # [phone]  Permission to leave phone message Yes     Patient questions:  Do you have a fever, pain , or abdominal swelling? No. Pain Score  0 *  Have you tolerated food without any problems? Yes.    Have you been able to return to your normal activities? Yes.    Do you have any questions about your discharge instructions: Diet   No. Medications  No. Follow up visit  No.  Do you have questions or concerns about your Care? No.  Actions: * If pain score is 4 or above: No action needed, pain <4.

## 2019-07-11 ENCOUNTER — Encounter: Payer: Self-pay | Admitting: Gastroenterology

## 2019-08-30 ENCOUNTER — Encounter: Payer: Self-pay | Admitting: Gastroenterology

## 2019-11-18 ENCOUNTER — Encounter: Payer: BC Managed Care – PPO | Admitting: Gastroenterology

## 2019-12-06 ENCOUNTER — Encounter: Payer: Self-pay | Admitting: Gastroenterology

## 2019-12-06 ENCOUNTER — Ambulatory Visit (AMBULATORY_SURGERY_CENTER): Payer: Self-pay | Admitting: *Deleted

## 2019-12-06 ENCOUNTER — Other Ambulatory Visit: Payer: Self-pay

## 2019-12-06 VITALS — Ht 61.0 in | Wt 152.0 lb

## 2019-12-06 DIAGNOSIS — Z800 Family history of malignant neoplasm of digestive organs: Secondary | ICD-10-CM

## 2019-12-06 MED ORDER — SUTAB 1479-225-188 MG PO TABS: 1.0000 | ORAL_TABLET | ORAL | 0 refills | Status: DC

## 2019-12-06 NOTE — Progress Notes (Signed)
Patient denies any allergies to egg or soy products. Patient denies complications with anesthesia/sedation.  Patient denies oxygen use at home and denies diet medications. Emmi instructions for colonoscopy explained but patient denied information. Patient is fully vaccinated for covid, last injections in 2  .

## 2019-12-20 ENCOUNTER — Ambulatory Visit (AMBULATORY_SURGERY_CENTER): Payer: BC Managed Care – PPO | Admitting: Gastroenterology

## 2019-12-20 ENCOUNTER — Encounter: Payer: Self-pay | Admitting: Gastroenterology

## 2019-12-20 ENCOUNTER — Other Ambulatory Visit: Payer: Self-pay

## 2019-12-20 ENCOUNTER — Other Ambulatory Visit: Payer: Self-pay | Admitting: Gastroenterology

## 2019-12-20 VITALS — BP 123/83 | HR 63 | Temp 98.2°F | Resp 15 | Ht 61.0 in | Wt 152.0 lb

## 2019-12-20 DIAGNOSIS — K635 Polyp of colon: Secondary | ICD-10-CM

## 2019-12-20 DIAGNOSIS — D120 Benign neoplasm of cecum: Secondary | ICD-10-CM

## 2019-12-20 DIAGNOSIS — Z800 Family history of malignant neoplasm of digestive organs: Secondary | ICD-10-CM | POA: Diagnosis present

## 2019-12-20 DIAGNOSIS — Z1211 Encounter for screening for malignant neoplasm of colon: Secondary | ICD-10-CM | POA: Diagnosis not present

## 2019-12-20 MED ORDER — SODIUM CHLORIDE 0.9 % IV SOLN: 500.0000 mL | Freq: Once | INTRAVENOUS | Status: DC

## 2019-12-20 NOTE — Progress Notes (Signed)
Pt's states no medical or surgical changes since previsit or office visit.  CW - vitals 

## 2019-12-20 NOTE — Op Note (Signed)
[facility] Patient Name: Joy Foster Procedure Date: 12/20/2019 7:01 AM MRN: [id number] Endoscopist: Mauri Pole , MD Age: 49 Referring MD:  Date of Birth: 08-12-1970 Gender: Female Account #: [id number] Procedure:                Colonoscopy Indications:              Screening in patient at increased risk: Family                            history of 1st-degree relative with colorectal                            cancer before age 68 years Medicines:                Monitored Anesthesia Care Procedure:                Pre-Anesthesia Assessment:                           - Prior to the procedure, a History and Physical                            was performed, and patient medications and                            allergies were reviewed. The patient's tolerance of                            previous anesthesia was also reviewed. The risks                            and benefits of the procedure and the sedation                            options and risks were discussed with the patient.                            All questions were answered, and informed consent                            was obtained. Prior Anticoagulants: The patient has                            taken no previous anticoagulant or antiplatelet                            agents. ASA Grade Assessment: II - A patient with                            mild systemic disease. After reviewing the risks                            and benefits, the patient was deemed in  satisfactory condition to undergo the procedure.                           After obtaining informed consent, the colonoscope                            was passed under direct vision. Throughout the                            procedure, the patient's blood pressure, pulse, and                            oxygen saturations were monitored continuously. The                            Colonoscope was introduced  through the anus and                            advanced to the the cecum, identified by                            appendiceal orifice and ileocecal valve. The                            colonoscopy was performed without difficulty. The                            patient tolerated the procedure well. The quality                            of the bowel preparation was excellent. The                            ileocecal valve, appendiceal orifice, and rectum                            were photographed. Scope In: 8:15:17 AM Scope Out: 8:28:55 AM Scope Withdrawal Time: 0 hours 7 minutes 5 seconds  Total Procedure Duration: 0 hours 13 minutes 38 seconds  Findings:                 The perianal and digital rectal examinations were                            normal.                           A less than 1 mm polyp was found in the cecum. The                            polyp was sessile. The polyp was removed with a                            cold biopsy forceps. Resection and retrieval were  complete.                           Non-bleeding external and internal hemorrhoids were                            found during retroflexion. The hemorrhoids were                            large.                           The exam was otherwise without abnormality. Complications:            No immediate complications. Estimated Blood Loss:     Estimated blood loss was minimal. Impression:               - One less than 1 mm polyp in the cecum, removed                            with a cold biopsy forceps. Resected and retrieved.                           - Non-bleeding external and internal hemorrhoids.                           - The examination was otherwise normal. Recommendation:           - Patient has a contact number available for                            emergencies. The signs and symptoms of potential                            delayed complications were discussed with  the                            patient. Return to normal activities tomorrow.                            Written discharge instructions were provided to the                            patient.                           - Resume previous diet.                           - Continue present medications.                           - Await pathology results.                           - Repeat colonoscopy in 5 years for surveillance  based on pathology results. Mauri Pole, MD 12/20/2019 8:33:41 AM This report has been signed electronically.

## 2019-12-20 NOTE — Progress Notes (Signed)
Called to room to assist during endoscopic procedure.  Patient ID and intended procedure confirmed with present staff. Received instructions for my participation in the procedure from the performing physician.  

## 2019-12-20 NOTE — Progress Notes (Signed)
To PACU, VSS. Report to [email or web address] 

## 2019-12-20 NOTE — Patient Instructions (Signed)
Please read over handouts about polyps, hemorrhoids and hemorrhoidal banding  Continue your normal medications  Await pathology  Next colonoscopy in 5 years  YOU HAD AN ENDOSCOPIC PROCEDURE TODAY AT [facility]:   Refer to the procedure report that was given to you for any specific questions about what was found during the examination.  If the procedure report does not answer your questions, please call your gastroenterologist to clarify.  If you requested that your care partner not be given the details of your procedure findings, then the procedure report has been included in a sealed envelope for you to review at your convenience later.  YOU SHOULD EXPECT: Some feelings of bloating in the abdomen. Passage of more gas than usual.  Walking can help get rid of the air that was put into your GI tract during the procedure and reduce the bloating. If you had a lower endoscopy (such as a colonoscopy or flexible sigmoidoscopy) you may notice spotting of blood in your stool or on the toilet paper. If you underwent a bowel prep for your procedure, you may not have a normal bowel movement for a few days.  Please Note:  You might notice some irritation and congestion in your nose or some drainage.  This is from the oxygen used during your procedure.  There is no need for concern and it should clear up in a day or so.  SYMPTOMS TO REPORT IMMEDIATELY:   Following lower endoscopy (colonoscopy or flexible sigmoidoscopy):  Excessive amounts of blood in the stool  Significant tenderness or worsening of abdominal pains  Swelling of the abdomen that is new, acute  Fever of 100F or higher  For urgent or emergent issues, a gastroenterologist can be reached at any hour by calling [phone]. Do not use MyChart messaging for urgent concerns.    DIET:  We do recommend a small meal at first, but then you may proceed to your regular diet.  Drink plenty of fluids but you should avoid alcoholic  beverages for 24 hours.  ACTIVITY:  You should plan to take it easy for the rest of today and you should NOT DRIVE or use heavy machinery until tomorrow (because of the sedation medicines used during the test).    FOLLOW UP: Our staff will call the number listed on your records 48-72 hours following your procedure to check on you and address any questions or concerns that you may have regarding the information given to you following your procedure. If we do not reach you, we will leave a message.  We will attempt to reach you two times.  During this call, we will ask if you have developed any symptoms of COVID 19. If you develop any symptoms (ie: fever, flu-like symptoms, shortness of breath, cough etc.) before then, please call [phone].  If you test positive for Covid 19 in the 2 weeks post procedure, please call and report this information to [place].    If any biopsies were taken you will be contacted by phone or by letter within the next 1-3 weeks.  Please call us at [phone] if you have not heard about the biopsies in 3 weeks.    SIGNATURES/CONFIDENTIALITY: You and/or your care partner have signed paperwork which will be entered into your electronic medical record.  These signatures attest to the fact that that the information above on your After Visit Summary has been reviewed and is understood.  Full responsibility of the confidentiality of this discharge information  lies with you and/or your care-partner.

## 2019-12-21 ENCOUNTER — Telehealth: Payer: Self-pay

## 2019-12-21 NOTE — Telephone Encounter (Signed)
°  Follow up Call-  Call back number 12/20/2019  Post procedure Call Back phone  # [phone]  Permission to leave phone message Yes  Some recent data might be hidden     Patient questions:  Do you have a fever, pain , or abdominal swelling? No. Pain Score  0 *  Have you tolerated food without any problems? Yes.    Have you been able to return to your normal activities? Yes.    Do you have any questions about your discharge instructions: Diet   No. Medications  No. Follow up visit  No.  Do you have questions or concerns about your Care? No.  Actions: * If pain score is 4 or above: No action needed, pain <4.  1. Have you developed a fever since your procedure? no  2.   Have you had an respiratory symptoms (SOB or cough) since your procedure? no  3.   Have you tested positive for COVID 19 since your procedure no  4.   Have you had any family members/close contacts diagnosed with the COVID 19 since your procedure?  no   If yes to any of these questions please route to Joylene John, RN and Joella Prince, RN

## 2020-01-09 ENCOUNTER — Encounter: Payer: Self-pay | Admitting: Gastroenterology

## 2023-10-21 ENCOUNTER — Other Ambulatory Visit: Payer: Self-pay | Admitting: Medical Genetics

## 2023-12-14 ENCOUNTER — Other Ambulatory Visit
Admission: RE | Admit: 2023-12-14 | Discharge: 2023-12-14 | Disposition: A | Discharge status: Home / Self Care | Payer: Self-pay | Source: Ambulatory Visit | Attending: Medical Genetics | Admitting: Medical Genetics

## 2023-12-28 LAB — GENECONNECT MOLECULAR SCREEN: Genetic Analysis Overall Interpretation: NEGATIVE
